# Patient Record
Sex: Female | Born: 1985 | Race: Black or African American | Hispanic: No | Marital: Single | Smoking: Current some day smoker
Health system: Southern US, Community
[De-identification: ages and names within clinical notes are randomized; demographics above are authoritative.]

---

## 2008-06-27 ENCOUNTER — Emergency Department (HOSPITAL_COMMUNITY): Admission: EM | Admit: 2008-06-27 | Discharge: 2008-06-27 | Payer: Self-pay | Admitting: Emergency Medicine

## 2009-01-21 ENCOUNTER — Emergency Department (HOSPITAL_COMMUNITY): Admission: EM | Admit: 2009-01-21 | Discharge: 2009-01-22 | Payer: Self-pay | Admitting: Emergency Medicine

## 2009-08-23 ENCOUNTER — Emergency Department (HOSPITAL_COMMUNITY): Admission: EM | Admit: 2009-08-23 | Discharge: 2009-08-23 | Payer: Self-pay | Admitting: Family Medicine

## 2010-05-27 ENCOUNTER — Emergency Department (HOSPITAL_COMMUNITY)
Admission: EM | Admit: 2010-05-27 | Discharge: 2010-05-27 | Disposition: A | Discharge status: Home / Self Care | Payer: PRIVATE HEALTH INSURANCE | Attending: Emergency Medicine | Admitting: Emergency Medicine

## 2010-05-27 DIAGNOSIS — K089 Disorder of teeth and supporting structures, unspecified: Secondary | ICD-10-CM | POA: Insufficient documentation

## 2010-06-11 LAB — POCT I-STAT, CHEM 8
Calcium, Ion: 1.17 mmol/L (ref 1.12–1.32)
Creatinine, Ser: 0.8 mg/dL (ref 0.4–1.2)
Glucose, Bld: 88 mg/dL (ref 70–99)
HCT: 40 % (ref 36.0–46.0)
Hemoglobin: 13.6 g/dL (ref 12.0–15.0)
TCO2: 26 mmol/L (ref 0–100)

## 2010-06-11 LAB — URINALYSIS, ROUTINE W REFLEX MICROSCOPIC
Ketones, ur: 15 mg/dL — AB
Nitrite: NEGATIVE
Protein, ur: 100 mg/dL — AB
Specific Gravity, Urine: 1.037 — ABNORMAL HIGH (ref 1.005–1.030)
Urobilinogen, UA: 1 mg/dL (ref 0.0–1.0)

## 2010-06-11 LAB — WET PREP, GENITAL: Yeast Wet Prep HPF POC: NONE SEEN

## 2010-06-11 LAB — POCT PREGNANCY, URINE: Preg Test, Ur: NEGATIVE

## 2010-06-11 LAB — URINE MICROSCOPIC-ADD ON

## 2010-06-11 LAB — GC/CHLAMYDIA PROBE AMP, GENITAL: GC Probe Amp, Genital: NEGATIVE

## 2010-11-10 ENCOUNTER — Emergency Department (HOSPITAL_COMMUNITY)
Admission: EM | Admit: 2010-11-10 | Discharge: 2010-11-10 | Disposition: A | Discharge status: Home / Self Care | Payer: Self-pay | Attending: Emergency Medicine | Admitting: Emergency Medicine

## 2010-11-10 DIAGNOSIS — K089 Disorder of teeth and supporting structures, unspecified: Secondary | ICD-10-CM | POA: Insufficient documentation

## 2010-11-10 DIAGNOSIS — K029 Dental caries, unspecified: Secondary | ICD-10-CM | POA: Insufficient documentation

## 2013-02-22 ENCOUNTER — Encounter (HOSPITAL_COMMUNITY): Payer: Self-pay | Admitting: Emergency Medicine

## 2013-02-22 ENCOUNTER — Emergency Department (HOSPITAL_COMMUNITY): Payer: PRIVATE HEALTH INSURANCE

## 2013-02-22 ENCOUNTER — Emergency Department (HOSPITAL_COMMUNITY)
Admission: EM | Admit: 2013-02-22 | Discharge: 2013-02-22 | Disposition: A | Discharge status: Home / Self Care | Payer: PRIVATE HEALTH INSURANCE | Attending: Emergency Medicine | Admitting: Emergency Medicine

## 2013-02-22 DIAGNOSIS — B9789 Other viral agents as the cause of diseases classified elsewhere: Secondary | ICD-10-CM | POA: Insufficient documentation

## 2013-02-22 DIAGNOSIS — R5381 Other malaise: Secondary | ICD-10-CM | POA: Insufficient documentation

## 2013-02-22 DIAGNOSIS — F17200 Nicotine dependence, unspecified, uncomplicated: Secondary | ICD-10-CM | POA: Insufficient documentation

## 2013-02-22 DIAGNOSIS — R52 Pain, unspecified: Secondary | ICD-10-CM | POA: Insufficient documentation

## 2013-02-22 DIAGNOSIS — B349 Viral infection, unspecified: Secondary | ICD-10-CM

## 2013-02-22 DIAGNOSIS — R110 Nausea: Secondary | ICD-10-CM | POA: Insufficient documentation

## 2013-02-22 DIAGNOSIS — Z79899 Other long term (current) drug therapy: Secondary | ICD-10-CM | POA: Insufficient documentation

## 2013-02-22 DIAGNOSIS — J029 Acute pharyngitis, unspecified: Secondary | ICD-10-CM | POA: Insufficient documentation

## 2013-02-22 LAB — RAPID STREP SCREEN (MED CTR MEBANE ONLY): Streptococcus, Group A Screen (Direct): NEGATIVE

## 2013-02-22 MED ORDER — IBUPROFEN 200 MG PO TABS
400.0000 mg | ORAL_TABLET | Freq: Once | ORAL | Status: AC
  Administered 2013-02-22: 400 mg via ORAL
  Filled 2013-02-22: qty 2

## 2013-02-22 MED ORDER — ALBUTEROL SULFATE HFA 108 (90 BASE) MCG/ACT IN AERS
2.0000 | INHALATION_SPRAY | RESPIRATORY_TRACT | Status: AC
  Administered 2013-02-22: 2 via RESPIRATORY_TRACT
  Filled 2013-02-22: qty 6.7

## 2013-02-22 MED ORDER — BENZONATATE 100 MG PO CAPS: 100.0000 mg | ORAL_CAPSULE | Freq: Three times a day (TID) | ORAL | Status: DC | PRN

## 2013-02-22 MED ORDER — ACETAMINOPHEN 500 MG PO TABS
1000.0000 mg | ORAL_TABLET | Freq: Once | ORAL | Status: AC
  Administered 2013-02-22: 1000 mg via ORAL
  Filled 2013-02-22: qty 2

## 2013-02-22 NOTE — ED Notes (Signed)
Pt reports onset of symptoms on Monday, cough, loss of voice, interment pain with breathing, chills and cold, diarrhea x3 day and nausea.

## 2013-02-22 NOTE — ED Provider Notes (Signed)
CSN: [id number]     Arrival date & time 02/22/13  0746 History   First MD Initiated Contact with Patient 02/22/13 [id number]     Chief Complaint  Patient presents with  . Cough  . Chills    HPI Pt was seen at 0755.  Per pt, c/o gradual onset and persistence of constant sore throat, runny/stuffy nose, sinus congestion, ears congestion, sneezing, nausea, generalized body aches/fatigue, and cough for the past 2 days.  Denies fevers, no rash, no CP/SOB, no vomiting/diarrhea, no abd pain.    History reviewed. No pertinent past medical history.  History reviewed. No pertinent past surgical history.  History  Substance Use Topics  . Smoking status: Current Some Day Smoker  . Smokeless tobacco: Not on file  . Alcohol Use: 0.0 oz/week     Comment: Every other day    Review of Systems ROS: Statement: All systems negative except as marked or noted in the HPI; Constitutional: Negative for fever and chills. +generalized body aches/fatigue. ; ; Eyes: Negative for eye pain, redness and discharge. ; ; ENMT: Negative for ear pain, hoarseness, +ears congestion, nasal congestion, rhinorrhea, sneezing, sinus pressure and sore throat. ; ; Cardiovascular: Negative for chest pain, palpitations, diaphoresis, dyspnea and peripheral edema. ; ; Respiratory: +cough. Negative for wheezing and stridor. ; ; Gastrointestinal: +nausea. Negative for vomiting, diarrhea, abdominal pain, blood in stool, hematemesis, jaundice and rectal bleeding. . ; ; Genitourinary: Negative for dysuria, flank pain and hematuria. ; ; Musculoskeletal: Negative for back pain and neck pain. Negative for swelling and trauma.; ; Skin: Negative for pruritus, rash, abrasions, blisters, bruising and skin lesion.; ; Neuro: Negative for headache, lightheadedness and neck stiffness. Negative for weakness, altered level of consciousness , altered mental status, extremity weakness, paresthesias, involuntary movement, seizure and syncope.       Allergies   Review of patient's allergies indicates no known allergies.  Home Medications   Current Outpatient Rx  Name  Route  Sig  Dispense  Refill  . norethindrone-ethinyl estradiol (JUNEL FE,GILDESS FE,LOESTRIN FE) 1-20 MG-MCG tablet   Oral   Take 1 tablet by mouth daily.         . sodium-potassium bicarbonate (ALKA-SELTZER GOLD) TBEF dissolvable tablet   Oral   Take 1 tablet by mouth daily as needed (cold).          BP 122/79  Pulse 95  Temp(Src) 97.7 F (36.5 C)  Resp 20  Ht 5\' 9"  (1.753 m)  SpO2 99%  LMP 02/15/2013 Physical Exam 0800: Physical examination:  Nursing notes reviewed; Vital signs and O2 SAT reviewed;  Constitutional: Well developed, Well nourished, Well hydrated, In no acute distress; Head:  Normocephalic, atraumatic; Eyes: EOMI, PERRL, No scleral icterus; ENMT: TM's clear bilat. +edemetous nasal turbinates bilat with clear rhinorrhea. Mouth and pharynx without lesions. No tonsillar exudates. No intra-oral edema. No submandibular or sublingual edema. No hoarse voice, no drooling, no stridor. No pain with manipulation of larynx. Mouth and pharynx normal, Mucous membranes moist; Neck: Supple, Full range of motion, No lymphadenopathy; Cardiovascular: Regular rate and rhythm, No murmur, rub, or gallop; Respiratory: Breath sounds clear & equal bilaterally, No rales, rhonchi, wheezes.  Speaking full sentences with ease, Normal respiratory effort/excursion; Chest: Nontender, Movement normal; Abdomen: Soft, Nontender, Nondistended, Normal bowel sounds; Genitourinary: No CVA tenderness; Extremities: Pulses normal, No tenderness, No edema, No calf edema or asymmetry.; Neuro: AA&Ox3, Major CN grossly intact.  Speech clear. Climbs on and off stretcher easily by herself. Gait steady. No gross focal  motor or sensory deficits in extremities.; Skin: Color normal, Warm, Dry.   ED Course  Procedures   EKG Interpretation   None       MDM  MDM Reviewed: previous chart, nursing note  and vitals Interpretation: labs and x-ray     Results for orders placed during the hospital encounter of 02/22/13  RAPID STREP SCREEN      Result Value Range   Streptococcus, Group A Screen (Direct) NEGATIVE  NEGATIVE   Dg Chest 2 View 02/22/2013   CLINICAL DATA:  Occasional smoker, congestion, shortness of breath  EXAM: CHEST  2 VIEW  COMPARISON:  None.  FINDINGS: The heart size and mediastinal contours are within normal limits. Both lungs are clear. The visualized skeletal structures are unremarkable.  IMPRESSION: No active cardiopulmonary disease.   Electronically Signed   By: Elige Ko   On: 02/22/2013 08:26    0935:  Appears viral illness, will tx symptomatically at this time. Dx and testing d/w pt.  Questions answered.  Verb understanding, agreeable to d/c home with outpt f/u.   Laray Anger, DO 02/25/13 1102

## 2013-02-24 LAB — CULTURE, GROUP A STREP

## 2013-04-01 ENCOUNTER — Emergency Department (HOSPITAL_COMMUNITY)
Admission: EM | Admit: 2013-04-01 | Discharge: 2013-04-01 | Disposition: A | Discharge status: Home / Self Care | Payer: PRIVATE HEALTH INSURANCE | Attending: Emergency Medicine | Admitting: Emergency Medicine

## 2013-04-01 DIAGNOSIS — Z79899 Other long term (current) drug therapy: Secondary | ICD-10-CM | POA: Insufficient documentation

## 2013-04-01 DIAGNOSIS — E669 Obesity, unspecified: Secondary | ICD-10-CM | POA: Insufficient documentation

## 2013-04-01 DIAGNOSIS — A088 Other specified intestinal infections: Secondary | ICD-10-CM | POA: Insufficient documentation

## 2013-04-01 DIAGNOSIS — Z3202 Encounter for pregnancy test, result negative: Secondary | ICD-10-CM | POA: Insufficient documentation

## 2013-04-01 DIAGNOSIS — F17200 Nicotine dependence, unspecified, uncomplicated: Secondary | ICD-10-CM | POA: Insufficient documentation

## 2013-04-01 DIAGNOSIS — A084 Viral intestinal infection, unspecified: Secondary | ICD-10-CM

## 2013-04-01 LAB — CBC WITH DIFFERENTIAL/PLATELET
Basophils Absolute: 0 10*3/uL (ref 0.0–0.1)
Basophils Relative: 0 % (ref 0–1)
EOS PCT: 1 % (ref 0–5)
Eosinophils Absolute: 0.1 10*3/uL (ref 0.0–0.7)
HEMATOCRIT: 37.2 % (ref 36.0–46.0)
Hemoglobin: 12.1 g/dL (ref 12.0–15.0)
LYMPHS ABS: 3.8 10*3/uL (ref 0.7–4.0)
LYMPHS PCT: 37 % (ref 12–46)
MCH: 25.4 pg — ABNORMAL LOW (ref 26.0–34.0)
MCHC: 32.5 g/dL (ref 30.0–36.0)
MCV: 78.2 fL (ref 78.0–100.0)
Monocytes Absolute: 0.5 10*3/uL (ref 0.1–1.0)
Monocytes Relative: 5 % (ref 3–12)
NEUTROS ABS: 5.6 10*3/uL (ref 1.7–7.7)
Neutrophils Relative %: 56 % (ref 43–77)
PLATELETS: 309 10*3/uL (ref 150–400)
RBC: 4.76 MIL/uL (ref 3.87–5.11)
RDW: 13.7 % (ref 11.5–15.5)
WBC: 10 10*3/uL (ref 4.0–10.5)

## 2013-04-01 LAB — COMPREHENSIVE METABOLIC PANEL
ALK PHOS: 99 U/L (ref 39–117)
ALT: 7 U/L (ref 0–35)
AST: 9 U/L (ref 0–37)
Albumin: 3.5 g/dL (ref 3.5–5.2)
BILIRUBIN TOTAL: 0.3 mg/dL (ref 0.3–1.2)
BUN: 10 mg/dL (ref 6–23)
CALCIUM: 9 mg/dL (ref 8.4–10.5)
CHLORIDE: 103 meq/L (ref 96–112)
CO2: 25 meq/L (ref 19–32)
Creatinine, Ser: 0.81 mg/dL (ref 0.50–1.10)
GLUCOSE: 95 mg/dL (ref 70–99)
Potassium: 4.3 mEq/L (ref 3.7–5.3)
SODIUM: 138 meq/L (ref 137–147)
Total Protein: 7.5 g/dL (ref 6.0–8.3)

## 2013-04-01 LAB — URINALYSIS, ROUTINE W REFLEX MICROSCOPIC
BILIRUBIN URINE: NEGATIVE
GLUCOSE, UA: NEGATIVE mg/dL
KETONES UR: NEGATIVE mg/dL
Nitrite: NEGATIVE
PROTEIN: NEGATIVE mg/dL
Specific Gravity, Urine: 1.024 (ref 1.005–1.030)
UROBILINOGEN UA: 1 mg/dL (ref 0.0–1.0)
pH: 5.5 (ref 5.0–8.0)

## 2013-04-01 LAB — URINE MICROSCOPIC-ADD ON

## 2013-04-01 LAB — POCT PREGNANCY, URINE: Preg Test, Ur: NEGATIVE

## 2013-04-01 LAB — LIPASE, BLOOD: Lipase: 46 U/L (ref 11–59)

## 2013-04-01 NOTE — ED Provider Notes (Signed)
CSN: [id number]     Arrival date & time 04/01/13  1519 History   First MD Initiated Contact with Patient 04/01/13 1845     No chief complaint on file.  (Consider location/radiation/quality/duration/timing/severity/associated sxs/prior Treatment) HPI Comments: Patient is a 28 year old obese female who presents to the emergency department complaining of generalized abdominal pain, nausea, vomiting and diarrhea x3 days. Patient states 4 nights ago she "went out for drinks" and the next morning woke up feeling as if she had a hangover. States she had multiple episodes of vomiting and diarrhea, continued over the next few days, however today her symptoms are beginning to subside and she is feeling much better. She has been eating a soft diet because solid foods make her symptoms worse. States she wanted to come to the emergency department "just to get checked out". States her menstrual cycle came on early this month and was heavier than normal. She is sexually active with one partner and uses protection. She is on birth control pills. Denies vaginal discharge. Denies increased urinary frequency, urgency or dysuria. Denies fever or chills. No hx of abdominal surgeries.  The history is provided by the patient.    No past medical history on file. No past surgical history on file. No family history on file. History  Substance Use Topics  . Smoking status: Current Some Day Smoker  . Smokeless tobacco: Not on file  . Alcohol Use: 0.0 oz/week     Comment: Every other day   OB History   Grav Para Term Preterm Abortions TAB SAB Ect Mult Living                 Review of Systems  Gastrointestinal: Positive for nausea, vomiting, abdominal pain and diarrhea.  All other systems reviewed and are negative.    Allergies  Review of patient's allergies indicates no known allergies.  Home Medications   Current Outpatient Rx  Name  Route  Sig  Dispense  Refill  . ibuprofen (ADVIL,MOTRIN) 200 MG tablet   Oral   Take 800 mg by mouth every 6 (six) hours as needed (pain).         . norethindrone-ethinyl estradiol (JUNEL FE,GILDESS FE,LOESTRIN FE) 1-20 MG-MCG tablet   Oral   Take 1 tablet by mouth daily.          BP 131/83  Pulse 85  Temp(Src) 99.4 F (37.4 C) (Oral)  Resp 16  SpO2 98%  LMP 03/25/2013 Physical Exam  Nursing note and vitals reviewed. Constitutional: She is oriented to person, place, and time. She appears well-developed and well-nourished. No distress.  Obese  HENT:  Head: Normocephalic and atraumatic.  Mouth/Throat: Oropharynx is clear and moist.  Eyes: Conjunctivae are normal.  Neck: Normal range of motion. Neck supple.  Cardiovascular: Normal rate, regular rhythm and normal heart sounds.   Pulmonary/Chest: Effort normal and breath sounds normal.  Abdominal: Soft. Bowel sounds are normal. She exhibits no distension. There is tenderness (mild across lower abdomen). There is no rigidity, no rebound and no guarding.  No peritoneal signs.  Musculoskeletal: Normal range of motion. She exhibits no edema.  Neurological: She is alert and oriented to person, place, and time.  Skin: Skin is warm and dry. She is not diaphoretic.  Psychiatric: She has a normal mood and affect. Her behavior is normal.    ED Course  Procedures (including critical care time) Labs Review Labs Reviewed  CBC WITH DIFFERENTIAL - Abnormal; Notable for the following:    MCH 25.4 (*)  All other components within normal limits  URINALYSIS, ROUTINE W REFLEX MICROSCOPIC - Abnormal; Notable for the following:    APPearance TURBID (*)    Hgb urine dipstick SMALL (*)    Leukocytes, UA MODERATE (*)    All other components within normal limits  URINE MICROSCOPIC-ADD ON - Abnormal; Notable for the following:    Squamous Epithelial / LPF MANY (*)    Bacteria, UA MANY (*)    All other components within normal limits  URINE CULTURE  COMPREHENSIVE METABOLIC PANEL  LIPASE, BLOOD  POCT PREGNANCY,  URINE   Imaging Review No results found.  EKG Interpretation   None       MDM   1. Viral gastroenteritis     Patient presenting with generalized lower abdominal pain, nausea, vomiting and diarrhea. She is well appearing and in no apparent distress, low-grade fever of 99.4, vitals otherwise normal. Symptoms today at greatly improved since the prior days. She is well appearing and in no apparent distress. States she is not nauseated in the emergency department. Labs obtained in triage prior to patient being seen and are normal. Urinalysis and urine pregnancy pending. Abdomen soft, mild lower abdominal tenderness without peritoneal signs. Plan to give PO challenge.  8:35 PM UA with 7-10 WBC, many bacteria and moderate leukocytes, nitrite negative. Many squamous cells, no tx at this time. Tolerated PO without difficulty. Stable for discharge. Return precautions given. Patient states understanding of treatment care plan and is agreeable.    Trevor MaceRobyn M Albert, PA-C 04/01/13 2036

## 2013-04-01 NOTE — ED Notes (Signed)
Pt c/o abd pain, NVD since Tuesday after drinking alcohol.

## 2013-04-01 NOTE — ED Notes (Signed)
Pt given ginger ale and crackers per PA order.

## 2013-04-01 NOTE — ED Provider Notes (Signed)
Medical screening examination/treatment/procedure(s) were performed by non-physician practitioner and as supervising physician I was immediately available for consultation/collaboration.  EKG Interpretation   None        Hieu Herms K Linker, MD 04/01/13 2042 

## 2013-04-01 NOTE — Discharge Instructions (Signed)
Viral Gastroenteritis Viral gastroenteritis is also known as stomach flu. This condition affects the stomach and intestinal tract. It can cause sudden diarrhea and vomiting. The illness typically lasts 3 to 8 days. Most people develop an immune response that eventually gets rid of the virus. While this natural response develops, the virus can make you quite ill. CAUSES  Many different viruses can cause gastroenteritis, such as rotavirus or noroviruses. You can catch one of these viruses by consuming contaminated food or water. You may also catch a virus by sharing utensils or other personal items with an infected person or by touching a contaminated surface. SYMPTOMS  The most common symptoms are diarrhea and vomiting. These problems can cause a severe loss of body fluids (dehydration) and a body salt (electrolyte) imbalance. Other symptoms may include:  Fever.  Headache.  Fatigue.  Abdominal pain. DIAGNOSIS  Your caregiver can usually diagnose viral gastroenteritis based on your symptoms and a physical exam. A stool sample may also be taken to test for the presence of viruses or other infections. TREATMENT  This illness typically goes away on its own. Treatments are aimed at rehydration. The most serious cases of viral gastroenteritis involve vomiting so severely that you are not able to keep fluids down. In these cases, fluids must be given through an intravenous line (IV). HOME CARE INSTRUCTIONS   Drink enough fluids to keep your urine clear or pale yellow. Drink small amounts of fluids frequently and increase the amounts as tolerated.  Ask your caregiver for specific rehydration instructions.  Avoid:  Foods high in sugar.  Alcohol.  Carbonated drinks.  Tobacco.  Juice.  Caffeine drinks.  Extremely hot or cold fluids.  Fatty, greasy foods.  Too much intake of anything at one time.  Dairy products until 24 to 48 hours after diarrhea stops.  You may consume probiotics.  Probiotics are active cultures of beneficial bacteria. They may lessen the amount and number of diarrheal stools in adults. Probiotics can be found in yogurt with active cultures and in supplements.  Wash your hands well to avoid spreading the virus.  Only take over-the-counter or prescription medicines for pain, discomfort, or fever as directed by your caregiver. Do not give aspirin to children. Antidiarrheal medicines are not recommended.  Ask your caregiver if you should continue to take your regular prescribed and over-the-counter medicines.  Keep all follow-up appointments as directed by your caregiver. SEEK IMMEDIATE MEDICAL CARE IF:   You are unable to keep fluids down.  You do not urinate at least once every 6 to 8 hours.  You develop shortness of breath.  You notice blood in your stool or vomit. This may look like coffee grounds.  You have abdominal pain that increases or is concentrated in one small area (localized).  You have persistent vomiting or diarrhea.  You have a fever.  The patient is a child younger than 3 months, and he or she has a fever.  The patient is a child older than 3 months, and he or she has a fever and persistent symptoms.  The patient is a child older than 3 months, and he or she has a fever and symptoms suddenly get worse.  The patient is a baby, and he or she has no tears when crying. MAKE SURE YOU:   Understand these instructions.  Will watch your condition.  Will get help right away if you are not doing well or get worse. Document Released: 02/23/2005 Document Revised: 05/18/2011 Document Reviewed: 12/10/2010   [employer] Patient Information 2014 [employer]  

## 2013-04-03 LAB — URINE CULTURE

## 2013-04-20 ENCOUNTER — Encounter (HOSPITAL_COMMUNITY): Payer: Self-pay | Admitting: Emergency Medicine

## 2013-04-20 ENCOUNTER — Emergency Department (HOSPITAL_COMMUNITY)
Admission: EM | Admit: 2013-04-20 | Discharge: 2013-04-20 | Disposition: A | Discharge status: Home / Self Care | Payer: PRIVATE HEALTH INSURANCE | Attending: Emergency Medicine | Admitting: Emergency Medicine

## 2013-04-20 DIAGNOSIS — R110 Nausea: Secondary | ICD-10-CM | POA: Insufficient documentation

## 2013-04-20 DIAGNOSIS — R300 Dysuria: Secondary | ICD-10-CM | POA: Insufficient documentation

## 2013-04-20 DIAGNOSIS — R1031 Right lower quadrant pain: Secondary | ICD-10-CM | POA: Insufficient documentation

## 2013-04-20 DIAGNOSIS — Z3202 Encounter for pregnancy test, result negative: Secondary | ICD-10-CM | POA: Insufficient documentation

## 2013-04-20 DIAGNOSIS — R6883 Chills (without fever): Secondary | ICD-10-CM | POA: Insufficient documentation

## 2013-04-20 DIAGNOSIS — F17200 Nicotine dependence, unspecified, uncomplicated: Secondary | ICD-10-CM | POA: Insufficient documentation

## 2013-04-20 DIAGNOSIS — R52 Pain, unspecified: Secondary | ICD-10-CM | POA: Insufficient documentation

## 2013-04-20 DIAGNOSIS — Z79899 Other long term (current) drug therapy: Secondary | ICD-10-CM | POA: Insufficient documentation

## 2013-04-20 DIAGNOSIS — R1032 Left lower quadrant pain: Secondary | ICD-10-CM | POA: Insufficient documentation

## 2013-04-20 DIAGNOSIS — R109 Unspecified abdominal pain: Secondary | ICD-10-CM

## 2013-04-20 DIAGNOSIS — R197 Diarrhea, unspecified: Secondary | ICD-10-CM | POA: Insufficient documentation

## 2013-04-20 LAB — COMPREHENSIVE METABOLIC PANEL
ALT: 9 U/L (ref 0–35)
AST: 13 U/L (ref 0–37)
Albumin: 3.8 g/dL (ref 3.5–5.2)
Alkaline Phosphatase: 86 U/L (ref 39–117)
BILIRUBIN TOTAL: 0.4 mg/dL (ref 0.3–1.2)
BUN: 10 mg/dL (ref 6–23)
CALCIUM: 9.1 mg/dL (ref 8.4–10.5)
CHLORIDE: 101 meq/L (ref 96–112)
CO2: 24 mEq/L (ref 19–32)
CREATININE: 0.8 mg/dL (ref 0.50–1.10)
GFR calc Af Amer: >90 mL/min (ref >90)
GLUCOSE: 75 mg/dL (ref 70–99)
Potassium: 4.2 mEq/L (ref 3.7–5.3)
Sodium: 139 mEq/L (ref 137–147)
Total Protein: 7.8 g/dL (ref 6.0–8.3)

## 2013-04-20 LAB — URINALYSIS, ROUTINE W REFLEX MICROSCOPIC
Bilirubin Urine: NEGATIVE
GLUCOSE, UA: NEGATIVE mg/dL
KETONES UR: 15 mg/dL — AB
Leukocytes, UA: NEGATIVE
Nitrite: NEGATIVE
Protein, ur: NEGATIVE mg/dL
Specific Gravity, Urine: 1.023 (ref 1.005–1.030)
UROBILINOGEN UA: 0.2 mg/dL (ref 0.0–1.0)
pH: 6 (ref 5.0–8.0)

## 2013-04-20 LAB — CBC WITH DIFFERENTIAL/PLATELET
BASOS ABS: 0 10*3/uL (ref 0.0–0.1)
Basophils Relative: 0 % (ref 0–1)
EOS PCT: 1 % (ref 0–5)
Eosinophils Absolute: 0.1 10*3/uL (ref 0.0–0.7)
HEMATOCRIT: 38.4 % (ref 36.0–46.0)
HEMOGLOBIN: 12.7 g/dL (ref 12.0–15.0)
LYMPHS ABS: 4 10*3/uL (ref 0.7–4.0)
LYMPHS PCT: 40 % (ref 12–46)
MCH: 25.8 pg — ABNORMAL LOW (ref 26.0–34.0)
MCHC: 33.1 g/dL (ref 30.0–36.0)
MCV: 77.9 fL — AB (ref 78.0–100.0)
MONO ABS: 0.5 10*3/uL (ref 0.1–1.0)
Monocytes Relative: 5 % (ref 3–12)
NEUTROS ABS: 5.3 10*3/uL (ref 1.7–7.7)
Neutrophils Relative %: 54 % (ref 43–77)
Platelets: 309 10*3/uL (ref 150–400)
RBC: 4.93 MIL/uL (ref 3.87–5.11)
RDW: 13.7 % (ref 11.5–15.5)
WBC: 9.9 10*3/uL (ref 4.0–10.5)

## 2013-04-20 LAB — POCT PREGNANCY, URINE: Preg Test, Ur: NEGATIVE

## 2013-04-20 LAB — URINE MICROSCOPIC-ADD ON

## 2013-04-20 LAB — WET PREP, GENITAL
Clue Cells Wet Prep HPF POC: NONE SEEN
TRICH WET PREP: NONE SEEN
YEAST WET PREP: NONE SEEN

## 2013-04-20 LAB — LIPASE, BLOOD: Lipase: 30 U/L (ref 11–59)

## 2013-04-20 MED ORDER — IBUPROFEN 200 MG PO TABS
600.0000 mg | ORAL_TABLET | Freq: Once | ORAL | Status: AC
  Administered 2013-04-20: 600 mg via ORAL
  Filled 2013-04-20: qty 3

## 2013-04-20 MED ORDER — ONDANSETRON 4 MG PO TBDP
4.0000 mg | ORAL_TABLET | Freq: Once | ORAL | Status: AC
  Administered 2013-04-20: 4 mg via ORAL
  Filled 2013-04-20: qty 1

## 2013-04-20 NOTE — ED Notes (Signed)
Lab called and stated that they spilled urine on the urine cup label and that urine would need to be re-collected

## 2013-04-20 NOTE — ED Notes (Signed)
Pt states she had a large soft BM this morning. Pt states that yesterday she had multiple loose stools. Pt states that most of the diarrhea occurred in the morning. Pt states she had been drinking beer before having the diarrhea. Pt states that the aches are mainly in her joints and the pain is in her abdomen.

## 2013-04-20 NOTE — ED Notes (Signed)
Pt c/o diarrhea, body aches and "feeling cold" since Tuesday.  Pt feels the s/s started after she popped an abscess that was on her L abdomen.

## 2013-04-20 NOTE — ED Provider Notes (Signed)
CSN: [id number]     Arrival date & time 04/20/13  1318 History   First MD Initiated Contact with Patient 04/20/13 1538     Chief Complaint  Patient presents with  . Diarrhea  . Generalized Body Aches     (Consider location/radiation/quality/duration/timing/severity/associated sxs/prior Treatment) Patient is a 28 y.o. female presenting with abdominal pain.  Abdominal Pain Pain location:  Suprapubic Pain quality: pressure   Pain radiates to:  Does not radiate Pain severity:  Moderate Onset quality:  Gradual Duration:  1 week Timing:  Constant Progression:  Unchanged Chronicity:  New Context comment:  Popped a "pimple' on her side, started feeling bad the next night. Relieved by:  Nothing Ineffective treatments:  None tried Associated symptoms: chills, diarrhea (6 stools yesterday.  ), dysuria and nausea   Associated symptoms: no chest pain, no cough, no fever, no shortness of breath and no vomiting     History reviewed. No pertinent past medical history. History reviewed. No pertinent past surgical history. No family history on file. History  Substance Use Topics  . Smoking status: Current Some Day Smoker  . Smokeless tobacco: Not on file  . Alcohol Use: 0.0 oz/week     Comment: Every other day   OB History   Grav Para Term Preterm Abortions TAB SAB Ect Mult Living                 Review of Systems  Constitutional: Positive for chills. Negative for fever.  HENT: Negative for congestion.   Respiratory: Negative for cough and shortness of breath.   Cardiovascular: Negative for chest pain.  Gastrointestinal: Positive for nausea, abdominal pain and diarrhea (6 stools yesterday.  ). Negative for vomiting.  Genitourinary: Positive for dysuria.  All other systems reviewed and are negative.      Allergies  Review of patient's allergies indicates no known allergies.  Home Medications   Current Outpatient Rx  Name  Route  Sig  Dispense  Refill  . albuterol  (PROVENTIL HFA;VENTOLIN HFA) 108 (90 BASE) MCG/ACT inhaler   Inhalation   Inhale 2 puffs into the lungs every 6 (six) hours as needed for wheezing or shortness of breath.         Marland Kitchen ibuprofen (ADVIL,MOTRIN) 200 MG tablet   Oral   Take 400-800 mg by mouth every 6 (six) hours as needed for headache (pain).          . norethindrone-ethinyl estradiol (JUNEL FE,GILDESS FE,LOESTRIN FE) 1-20 MG-MCG tablet   Oral   Take 1 tablet by mouth daily.          BP 134/89  Pulse 84  Temp(Src) 98.3 F (36.8 C) (Oral)  Resp 16  Ht 5\' 9"  (1.753 m)  Wt 329 lb (149.233 kg)  BMI 48.56 kg/m2  SpO2 98%  LMP 03/25/2013 Physical Exam  Nursing note and vitals reviewed. Constitutional: She is oriented to person, place, and time. She appears well-developed and well-nourished. No distress.  HENT:  Head: Normocephalic and atraumatic.  Mouth/Throat: Oropharynx is clear and moist.  Eyes: Conjunctivae are normal. Pupils are equal, round, and reactive to light. No scleral icterus.  Neck: Neck supple.  Cardiovascular: Normal rate, regular rhythm, normal heart sounds and intact distal pulses.   No murmur heard. Pulmonary/Chest: Effort normal and breath sounds normal. No stridor. No respiratory distress. She has no rales.  Abdominal: Soft. Bowel sounds are normal. She exhibits no distension. There is tenderness in the right lower quadrant, suprapubic area and left lower quadrant.  There is no rigidity, no rebound and no guarding.  Genitourinary: There is no rash or tenderness on the right labia. There is no rash or tenderness on the left labia. Uterus is tender. Uterus is not enlarged. Cervix exhibits no motion tenderness, no discharge and no friability. Right adnexum displays tenderness. Right adnexum displays no mass. Left adnexum displays tenderness. Left adnexum displays no mass. No vaginal discharge found.  Mild, nonfocal tenderness.    Musculoskeletal: Normal range of motion.  Neurological: She is alert and  oriented to person, place, and time.  Skin: Skin is warm and dry. No rash noted.  Psychiatric: She has a normal mood and affect. Her behavior is normal.    ED Course  Procedures (including critical care time) Labs Review Labs Reviewed  WET PREP, GENITAL - Abnormal; Notable for the following:    WBC, Wet Prep HPF POC FEW (*)    All other components within normal limits  CBC WITH DIFFERENTIAL - Abnormal; Notable for the following:    MCV 77.9 (*)    MCH 25.8 (*)    All other components within normal limits  URINALYSIS, ROUTINE W REFLEX MICROSCOPIC - Abnormal; Notable for the following:    Hgb urine dipstick TRACE (*)    Ketones, ur 15 (*)    All other components within normal limits  GC/CHLAMYDIA PROBE AMP  COMPREHENSIVE METABOLIC PANEL  LIPASE, BLOOD  URINE MICROSCOPIC-ADD ON  POCT PREGNANCY, URINE   Imaging Review No results found.  EKG Interpretation   None       MDM   Final diagnoses:  Abdominal pain  Diarrhea    28 yo female with 1 week of pressure like low abdominal pain.  Associated with diarrhea and dysuria.  No fevers, has had chills and body aches.  On exam, well appearing and nontoxic.  The "pimple" that she manipulated earlier in the week is gone without erythema, fluctuance, or induration.  I don't think that is the cause of her illness.    Labs unremarkable.  UA negative for signs of infection.  Repeat abdominal exams were benign.  Pelvic exam not c/w PID, TOA, or torsion.  Based on clinical picture, do not think she needs abdominal imaging.  However, advised strict return precautions, including fevers, localization of abdominal pain, inability to tolerate POs, etc.  Pt comfortable with plan and understood return precautions.     Candyce ChurnJohn David Tunisia Landgrebe III, MD 04/20/13 2027

## 2013-04-20 NOTE — Discharge Instructions (Signed)
Abdominal Pain, Women °Abdominal (stomach, pelvic, or belly) pain can be caused by many things. It is important to tell your doctor: °· The location of the pain. °· Does it come and go or is it present all the time? °· Are there things that start the pain (eating certain foods, exercise)? °· Are there other symptoms associated with the pain (fever, nausea, vomiting, diarrhea)? °All of this is helpful to know when trying to find the cause of the pain. °CAUSES  °· Stomach: virus or bacteria infection, or ulcer. °· Intestine: appendicitis (inflamed appendix), regional ileitis (Crohn's disease), ulcerative colitis (inflamed colon), irritable bowel syndrome, diverticulitis (inflamed diverticulum of the colon), or cancer of the stomach or intestine. °· Gallbladder disease or stones in the gallbladder. °· Kidney disease, kidney stones, or infection. °· Pancreas infection or cancer. °· Fibromyalgia (pain disorder). °· Diseases of the female organs: °· Uterus: fibroid (non-cancerous) tumors or infection. °· Fallopian tubes: infection or tubal pregnancy. °· Ovary: cysts or tumors. °· Pelvic adhesions (scar tissue). °· Endometriosis (uterus lining tissue growing in the pelvis and on the pelvic organs). °· Pelvic congestion syndrome (female organs filling up with blood just before the menstrual period). °· Pain with the menstrual period. °· Pain with ovulation (producing an egg). °· Pain with an IUD (intrauterine device, birth control) in the uterus. °· Cancer of the female organs. °· Functional pain (pain not caused by a disease, may improve without treatment). °· Psychological pain. °· Depression. °DIAGNOSIS  °Your doctor will decide the seriousness of your pain by doing an examination. °· Blood tests. °· X-rays. °· Ultrasound. °· CT scan (computed tomography, special type of X-ray). °· MRI (magnetic resonance imaging). °· Cultures, for infection. °· Barium enema (dye inserted in the large intestine, to better view it with  X-rays). °· Colonoscopy (looking in intestine with a lighted tube). °· Laparoscopy (minor surgery, looking in abdomen with a lighted tube). °· Major abdominal exploratory surgery (looking in abdomen with a large incision). °TREATMENT  °The treatment will depend on the cause of the pain.  °· Many cases can be observed and treated at home. °· Over-the-counter medicines recommended by your caregiver. °· Prescription medicine. °· Antibiotics, for infection. °· Birth control pills, for painful periods or for ovulation pain. °· Hormone treatment, for endometriosis. °· Nerve blocking injections. °· Physical therapy. °· Antidepressants. °· Counseling with a psychologist or psychiatrist. °· Minor or major surgery. °HOME CARE INSTRUCTIONS  °· Do not take laxatives, unless directed by your caregiver. °· Take over-the-counter pain medicine only if ordered by your caregiver. Do not take aspirin because it can cause an upset stomach or bleeding. °· Try a clear liquid diet (broth or water) as ordered by your caregiver. Slowly move to a bland diet, as tolerated, if the pain is related to the stomach or intestine. °· Have a thermometer and take your temperature several times a day, and record it. °· Bed rest and sleep, if it helps the pain. °· Avoid sexual intercourse, if it causes pain. °· Avoid stressful situations. °· Keep your follow-up appointments and tests, as your caregiver orders. °· If the pain does not go away with medicine or surgery, you may try: °· Acupuncture. °· Relaxation exercises (yoga, meditation). °· Group therapy. °· Counseling. °SEEK MEDICAL CARE IF:  °· You notice certain foods cause stomach pain. °· Your home care treatment is not helping your pain. °· You need stronger pain medicine. °· You want your IUD removed. °· You feel faint or   lightheaded. °· You develop nausea and vomiting. °· You develop a rash. °· You are having side effects or an allergy to your medicine. °SEEK IMMEDIATE MEDICAL CARE IF:  °· Your  pain does not go away or gets worse. °· You have a fever. °· Your pain is felt only in portions of the abdomen. The right side could possibly be appendicitis. The left lower portion of the abdomen could be colitis or diverticulitis. °· You are passing blood in your stools (bright red or black tarry stools, with or without vomiting). °· You have blood in your urine. °· You develop chills, with or without a fever. °· You pass out. °MAKE SURE YOU:  °· Understand these instructions. °· Will watch your condition. °· Will get help right away if you are not doing well or get worse. °Document Released: 12/21/2006 Document Revised: 05/18/2011 Document Reviewed: 01/10/2009 °[employer]® Patient Information ©2014 [employer]. ° °

## 2013-04-21 LAB — GC/CHLAMYDIA PROBE AMP
CT Probe RNA: NEGATIVE
GC Probe RNA: NEGATIVE

## 2013-05-16 ENCOUNTER — Emergency Department (HOSPITAL_COMMUNITY)
Admission: EM | Admit: 2013-05-16 | Discharge: 2013-05-16 | Disposition: A | Discharge status: Home / Self Care | Payer: PRIVATE HEALTH INSURANCE | Attending: Emergency Medicine | Admitting: Emergency Medicine

## 2013-05-16 ENCOUNTER — Encounter (HOSPITAL_COMMUNITY): Payer: Self-pay | Admitting: Emergency Medicine

## 2013-05-16 DIAGNOSIS — R21 Rash and other nonspecific skin eruption: Secondary | ICD-10-CM | POA: Insufficient documentation

## 2013-05-16 DIAGNOSIS — Z79899 Other long term (current) drug therapy: Secondary | ICD-10-CM | POA: Insufficient documentation

## 2013-05-16 DIAGNOSIS — F17200 Nicotine dependence, unspecified, uncomplicated: Secondary | ICD-10-CM | POA: Insufficient documentation

## 2013-05-16 NOTE — ED Notes (Signed)
Pt c/o boil both axilla--right boil draining; c/o bumps coming up all over body since yesterday; itching;

## 2013-05-16 NOTE — ED Provider Notes (Signed)
CSN: [id number]     Arrival date & time 05/16/13  0935 History   First MD Initiated Contact with Patient 05/16/13 [phone]     Chief Complaint  Patient presents with  . Rash     (Consider location/radiation/quality/duration/timing/severity/associated sxs/prior Treatment) HPI Comments: Patient with h/o axillary abscess presents with complaint of rash. She states that she developed a red itchy bumps yesterday after staying at a family member's house. Bumps or over her entire body including arms, legs, and lower back. She denies new medications or foods. She denies difficulty breathing, lightheadedness or syncope, abdominal pain, nausea, vomiting. The onset of this condition was acute. The course is constant. Aggravating factors: none. Alleviating factors: none.    The history is provided by the patient.    History reviewed. No pertinent past medical history. History reviewed. No pertinent past surgical history. No family history on file. History  Substance Use Topics  . Smoking status: Current Some Day Smoker  . Smokeless tobacco: Not on file  . Alcohol Use: 0.0 oz/week     Comment: Every other day   OB History   Grav Para Term Preterm Abortions TAB SAB Ect Mult Living                 Review of Systems  Constitutional: Negative for fever.  HENT: Negative for facial swelling and trouble swallowing.   Eyes: Negative for redness.  Respiratory: Negative for shortness of breath, wheezing and stridor.   Cardiovascular: Negative for chest pain.  Gastrointestinal: Negative for nausea and vomiting.  Musculoskeletal: Negative for myalgias.  Skin: Positive for rash.  Neurological: Negative for light-headedness.  Psychiatric/Behavioral: Negative for confusion.      Allergies  Review of patient's allergies indicates no known allergies.  Home Medications   Current Outpatient Rx  Name  Route  Sig  Dispense  Refill  . albuterol (PROVENTIL HFA;VENTOLIN HFA) 108 (90 BASE) MCG/ACT  inhaler   Inhalation   Inhale 2 puffs into the lungs every 6 (six) hours as needed for wheezing or shortness of breath.         Marland Kitchen. ibuprofen (ADVIL,MOTRIN) 200 MG tablet   Oral   Take 400-800 mg by mouth every 6 (six) hours as needed for headache (pain).          . norethindrone-ethinyl estradiol (JUNEL FE,GILDESS FE,LOESTRIN FE) 1-20 MG-MCG tablet   Oral   Take 1 tablet by mouth daily.          BP 99/68  Pulse 85  Temp(Src) 98.6 F (37 C)  Resp 20  SpO2 98%  LMP 04/18/2013 Physical Exam  Nursing note and vitals reviewed. Constitutional: She appears well-developed and well-nourished.  Normal mentation.   HENT:  Head: Normocephalic and atraumatic.  Mouth/Throat: Oropharynx is clear and moist.  No oral lesions.  Eyes: Conjunctivae are normal.  Conjunctiva clear.   Neck: Normal range of motion. Neck supple.  Pulmonary/Chest: No respiratory distress.  Neurological: She is alert.  Skin: Skin is warm and dry.  Psychiatric: She has a normal mood and affect.    ED Course  Procedures (including critical care time) Labs Review Labs Reviewed - No data to display Imaging Review No results found.   EKG Interpretation None      10:15 AM Patient seen and examined.   Vital signs reviewed and are as follows: Filed Vitals:   05/16/13 0940  BP: 99/68  Pulse: 85  Temp: 98.6 F (37 C)  Resp: 20   Patient counseled to  use oral Benadryl/Zyrtec, topical OTC steroid cream for symptomatically.  Patient is to return with worsening hives, difficulty breathing.   MDM   Final diagnoses:  Rash   Patient with allergic type rash most consistent with insect or bed bugs bites. No signs of anaphylaxis. No signs of SJS/TEN.     Renne Crigler, PA-C 05/16/13 1020

## 2013-05-16 NOTE — Discharge Instructions (Signed)
Please read and follow all provided instructions.  Your diagnoses today include:  1. Rash    Tests performed today include:  Vital signs. See below for your results today.   Medications prescribed:   None  Home care instructions:  Follow any educational materials contained in this packet.  Follow-up instructions: Please follow-up with your primary care provider as needed for further evaluation of your symptoms.  If you do not have a primary care doctor -- see below for referral information.   Return instructions:   Please return to the Emergency Department if you experience worsening symptoms.   Please return if you have any other emergent concerns.  Additional Information:  Your vital signs today were: BP 99/68   Pulse 85   Temp(Src) 98.6 F (37 C)   Resp 20   SpO2 98%   LMP 04/18/2013 If your blood pressure (BP) was elevated above 135/85 this visit, please have this repeated by your doctor within one month. ---------------

## 2013-05-16 NOTE — ED Provider Notes (Signed)
Medical screening examination/treatment/procedure(s) were performed by non-physician practitioner and as supervising physician I was immediately available for consultation/collaboration.   EKG Interpretation None       Ethelda ChickMartha K Linker, MD 05/16/13 1053

## 2015-06-24 IMAGING — CR DG CHEST 2V
2 series · 2 of 2 positions shown · non-contrast
Comparison: None.

CLINICAL DATA: Occasional smoker, congestion, shortness of breath

EXAM:
CHEST  2 VIEW

[w chest pa]
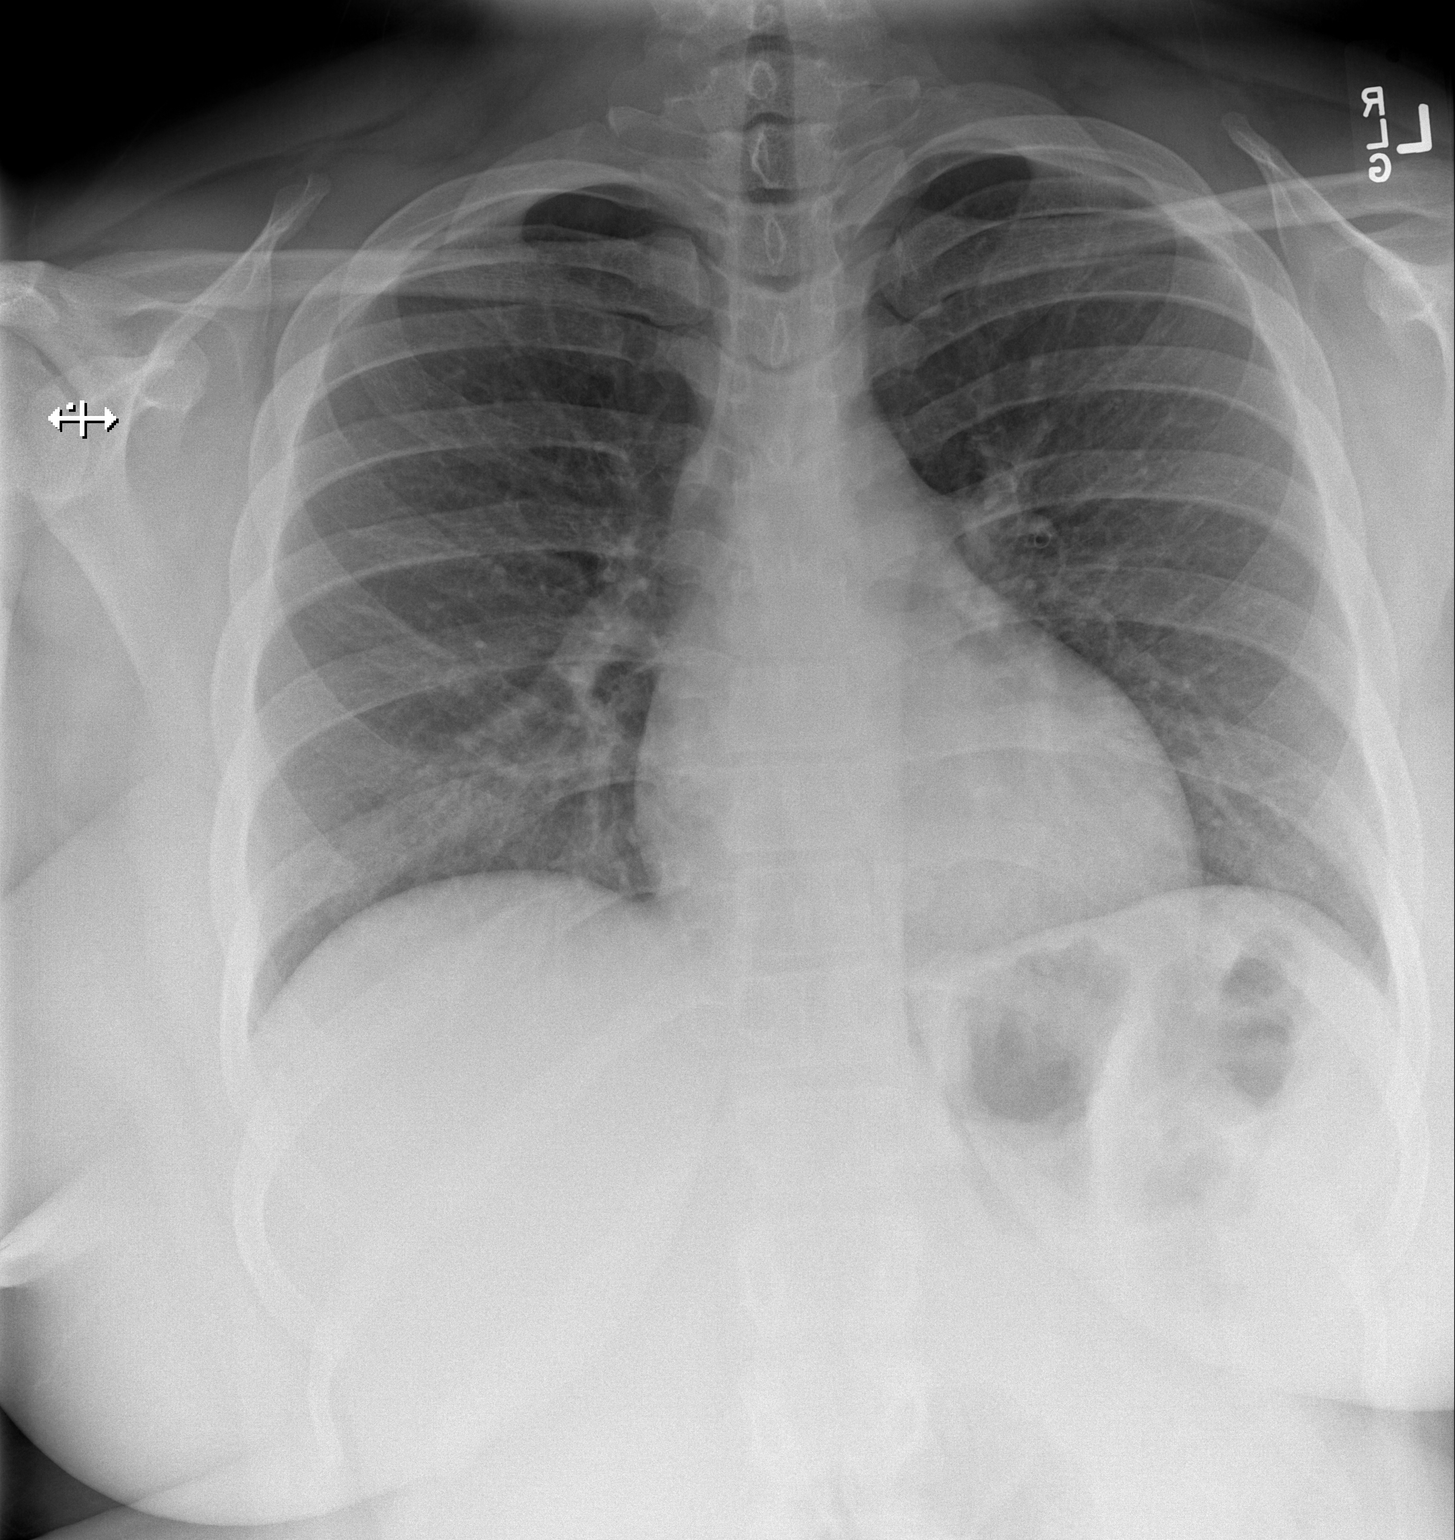

[w chest lat]
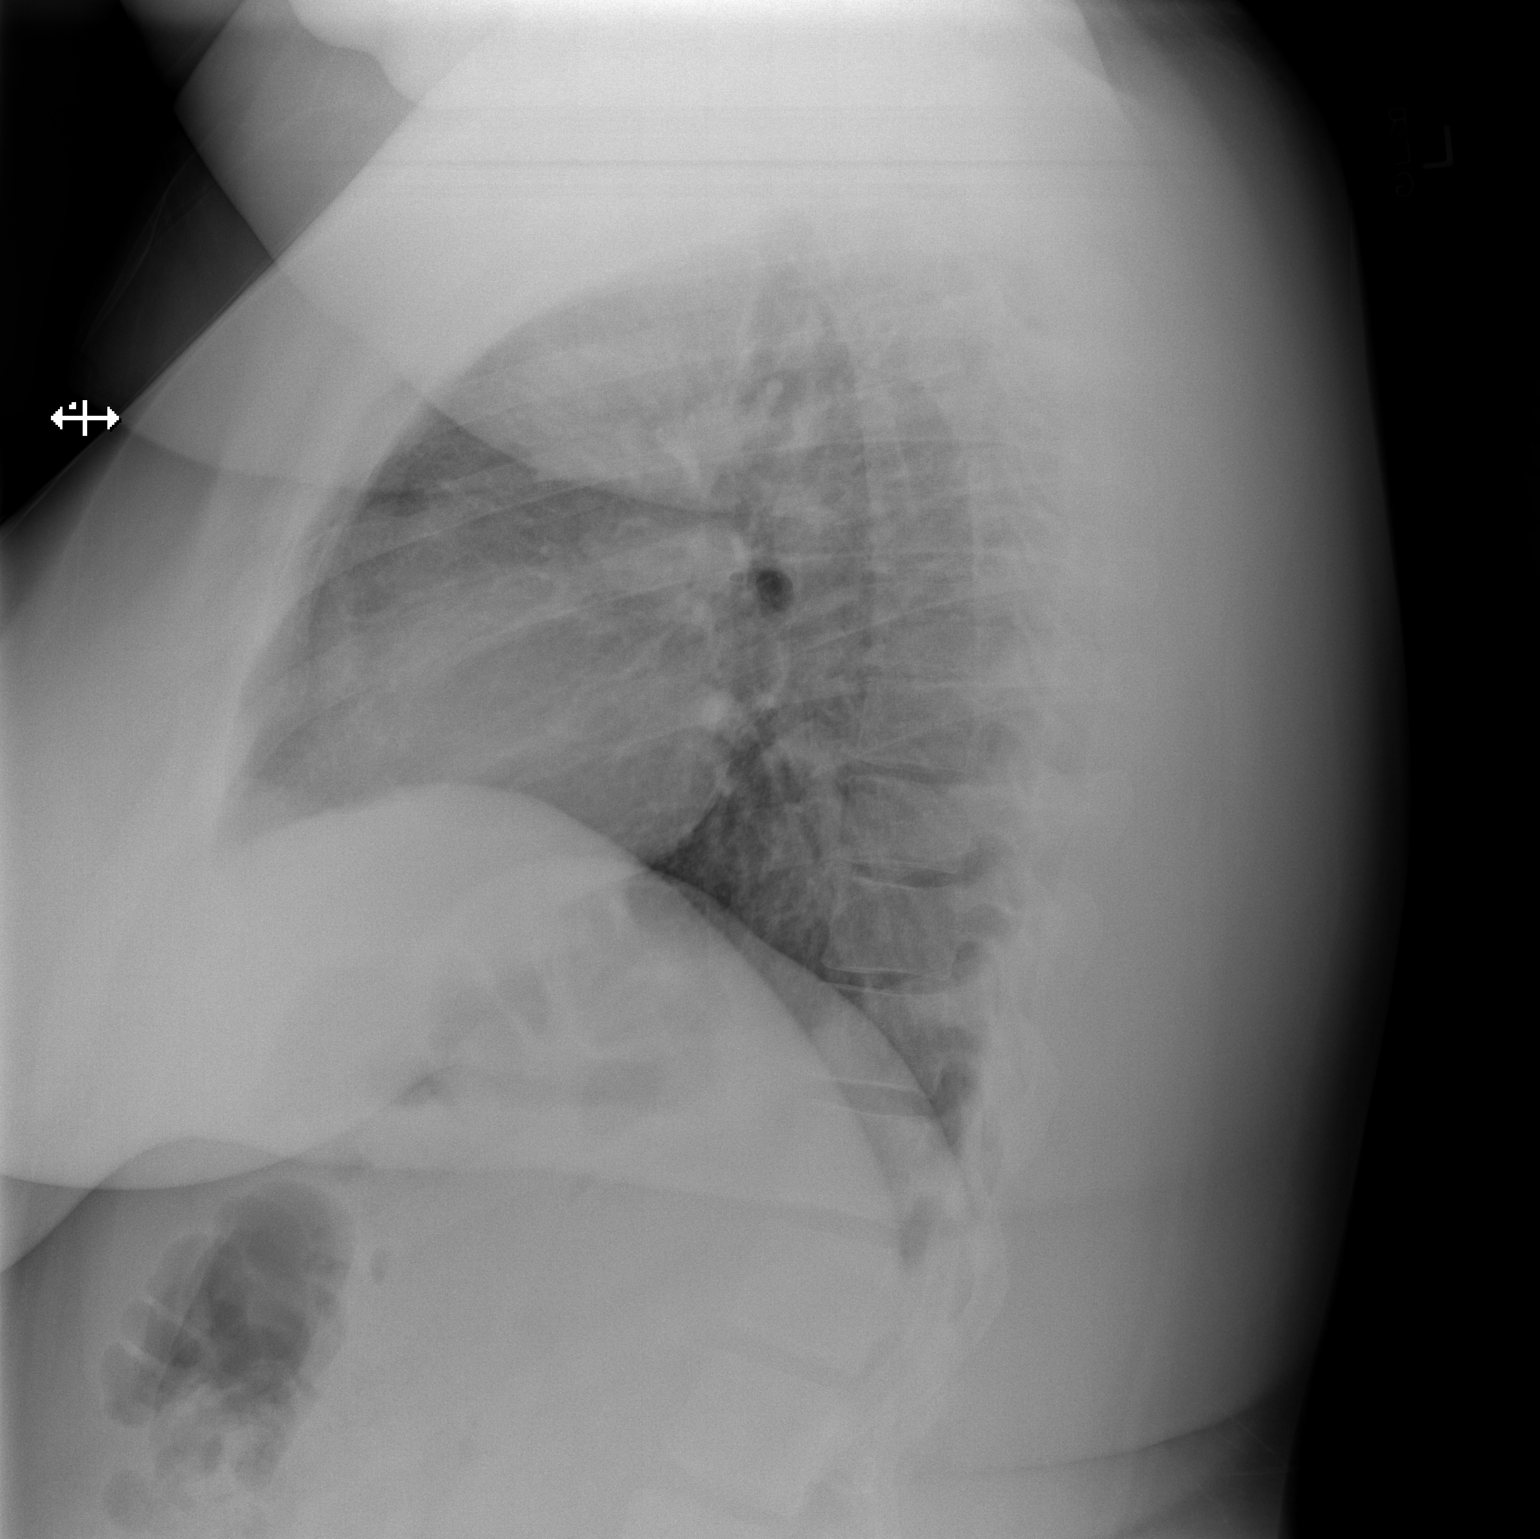

[2 of 2 positions shown; findings below may reference images not displayed]

FINDINGS: The heart size and mediastinal contours are within normal limits.
Both lungs are clear. The visualized skeletal structures are
unremarkable.
IMPRESSION: No active cardiopulmonary disease.
